# Patient Record
Sex: Male | Born: 1995 | Race: White | Hispanic: No | Marital: Single | Smoking: Never smoker
Health system: Southern US, Community
[De-identification: ages and names within clinical notes are randomized; demographics above are authoritative.]

## PROBLEM LIST (undated history)

## (undated) DIAGNOSIS — F909 Attention-deficit hyperactivity disorder, unspecified type: Secondary | ICD-10-CM

## (undated) DIAGNOSIS — J342 Deviated nasal septum: Secondary | ICD-10-CM

## (undated) HISTORY — DX: Deviated nasal septum: J34.2

## (undated) HISTORY — DX: Attention-deficit hyperactivity disorder, unspecified type: F90.9

---
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
## 2019-12-29 ENCOUNTER — Ambulatory Visit: Payer: Self-pay | Attending: Internal Medicine

## 2019-12-29 DIAGNOSIS — Z23 Encounter for immunization: Secondary | ICD-10-CM

## 2019-12-29 NOTE — Progress Notes (Signed)
   Covid-19 Vaccination Clinic  Name:  John Zuniga    MRN: [id number] DOB: July 20, 1995  12/29/2019  [name] was observed post Covid-19 immunization for 15 minutes without incident. He was provided with Vaccine Information Sheet and instruction to access the V-Safe system.   [name] was instructed to call 911 with any severe reactions post vaccine: Marland Kitchen Difficulty breathing  . Swelling of face and throat  . A fast heartbeat  . A bad rash all over body  . Dizziness and weakness   Immunizations Administered    Name Date Dose VIS Date Route   Pfizer COVID-19 Vaccine 12/29/2019 10:22 AM 0.3 mL 09/05/2018 Intramuscular   Manufacturer: [employer], [employer]   Lot: [id number]   NDC: [address]-4

## 2020-01-19 ENCOUNTER — Ambulatory Visit: Payer: Self-pay | Attending: Internal Medicine

## 2020-01-19 DIAGNOSIS — Z23 Encounter for immunization: Secondary | ICD-10-CM

## 2020-01-19 NOTE — Progress Notes (Signed)
   Covid-19 Vaccination Clinic  Name:  John Zuniga    MRN: [id number] DOB: 02-18-1996  01/19/2020  [name] was observed post Covid-19 immunization for 15 minutes without incident. He was provided with Vaccine Information Sheet and instruction to access the V-Safe system.   [name] was instructed to call 911 with any severe reactions post vaccine: Marland Kitchen Difficulty breathing  . Swelling of face and throat  . A fast heartbeat  . A bad rash all over body  . Dizziness and weakness   Immunizations Administered    Name Date Dose VIS Date Route   Pfizer COVID-19 Vaccine 01/19/2020 10:06 AM 0.3 mL 09/05/2018 Intramuscular   Manufacturer: [employer], [employer]   Lot: [id number]   NDC: [address]-0
